# Patient Record
Sex: Female | Born: 1945 | Race: White | Hispanic: No | Marital: Married
Health system: Southern US, Community
[De-identification: ages and names within clinical notes are randomized; demographics above are authoritative.]

---

## 1982-12-22 HISTORY — PX: BREAST EXCISIONAL BIOPSY: SUR124

## 2000-02-26 ENCOUNTER — Encounter: Payer: Self-pay | Admitting: Obstetrics and Gynecology

## 2000-02-26 ENCOUNTER — Encounter: Admission: RE | Admit: 2000-02-26 | Discharge: 2000-02-26 | Payer: Self-pay | Admitting: Obstetrics and Gynecology

## 2001-03-01 ENCOUNTER — Encounter: Admission: RE | Admit: 2001-03-01 | Discharge: 2001-03-01 | Payer: Self-pay | Admitting: Obstetrics and Gynecology

## 2001-03-01 ENCOUNTER — Encounter: Payer: Self-pay | Admitting: Obstetrics and Gynecology

## 2002-03-03 ENCOUNTER — Encounter: Payer: Self-pay | Admitting: Obstetrics and Gynecology

## 2002-03-03 ENCOUNTER — Encounter: Admission: RE | Admit: 2002-03-03 | Discharge: 2002-03-03 | Payer: Self-pay | Admitting: Obstetrics and Gynecology

## 2002-07-03 ENCOUNTER — Encounter (INDEPENDENT_AMBULATORY_CARE_PROVIDER_SITE_OTHER): Payer: Self-pay | Admitting: *Deleted

## 2002-07-03 ENCOUNTER — Encounter: Payer: Self-pay | Admitting: Emergency Medicine

## 2002-07-03 ENCOUNTER — Observation Stay (HOSPITAL_COMMUNITY): Admission: EM | Admit: 2002-07-03 | Discharge: 2002-07-04 | Payer: Self-pay | Admitting: Emergency Medicine

## 2003-03-06 ENCOUNTER — Encounter: Admission: RE | Admit: 2003-03-06 | Discharge: 2003-03-06 | Payer: Self-pay | Admitting: Obstetrics and Gynecology

## 2003-03-06 ENCOUNTER — Encounter: Payer: Self-pay | Admitting: Obstetrics and Gynecology

## 2004-03-06 ENCOUNTER — Encounter: Admission: RE | Admit: 2004-03-06 | Discharge: 2004-03-06 | Payer: Self-pay | Admitting: Obstetrics and Gynecology

## 2005-03-10 ENCOUNTER — Encounter: Admission: RE | Admit: 2005-03-10 | Discharge: 2005-03-10 | Payer: Self-pay | Admitting: Obstetrics and Gynecology

## 2006-03-11 ENCOUNTER — Encounter: Admission: RE | Admit: 2006-03-11 | Discharge: 2006-03-11 | Payer: Self-pay | Admitting: Obstetrics and Gynecology

## 2006-08-25 ENCOUNTER — Emergency Department (HOSPITAL_COMMUNITY): Admission: EM | Admit: 2006-08-25 | Discharge: 2006-08-25 | Payer: Self-pay | Admitting: Family Medicine

## 2007-03-25 ENCOUNTER — Encounter: Admission: RE | Admit: 2007-03-25 | Discharge: 2007-03-25 | Payer: Self-pay | Admitting: Obstetrics and Gynecology

## 2008-03-28 ENCOUNTER — Encounter: Admission: RE | Admit: 2008-03-28 | Discharge: 2008-03-28 | Payer: Self-pay | Admitting: Family Medicine

## 2008-05-17 ENCOUNTER — Emergency Department (HOSPITAL_COMMUNITY): Admission: EM | Admit: 2008-05-17 | Discharge: 2008-05-17 | Payer: Self-pay | Admitting: Emergency Medicine

## 2009-03-29 ENCOUNTER — Encounter: Admission: RE | Admit: 2009-03-29 | Discharge: 2009-03-29 | Payer: Self-pay | Admitting: Obstetrics and Gynecology

## 2010-04-01 ENCOUNTER — Encounter: Admission: RE | Admit: 2010-04-01 | Discharge: 2010-04-01 | Payer: Self-pay | Admitting: Obstetrics and Gynecology

## 2010-12-17 ENCOUNTER — Encounter
Admission: RE | Admit: 2010-12-17 | Discharge: 2010-12-17 | Payer: Self-pay | Source: Home / Self Care | Attending: Obstetrics and Gynecology | Admitting: Obstetrics and Gynecology

## 2011-03-17 ENCOUNTER — Other Ambulatory Visit: Payer: Self-pay | Admitting: Obstetrics and Gynecology

## 2011-03-17 DIAGNOSIS — Z1231 Encounter for screening mammogram for malignant neoplasm of breast: Secondary | ICD-10-CM

## 2011-04-08 ENCOUNTER — Ambulatory Visit
Admission: RE | Admit: 2011-04-08 | Discharge: 2011-04-08 | Disposition: A | Discharge status: Home / Self Care | Payer: Commercial Indemnity | Source: Ambulatory Visit | Attending: Obstetrics and Gynecology | Admitting: Obstetrics and Gynecology

## 2011-04-08 DIAGNOSIS — Z1231 Encounter for screening mammogram for malignant neoplasm of breast: Secondary | ICD-10-CM

## 2011-05-09 NOTE — H&P (Signed)
[place]  Patient:    Sonya Torres, Sonya Torres Visit Number: [id number] MRN: [id number]          Service Type: SUR Location: 5700 [id number] Attending Physician:  Brandy Hale Dictated by:   Angelia Mould. Derrell Lolling, M.D. Admit Date:  07/03/2002 Discharge Date: 07/04/2002   CC:         Forrestine Him, MD   History and Physical  CHIEF COMPLAINT:  Abdominal pain and vomiting.  HISTORY OF PRESENT ILLNESS:  This is a 65 year old white female in good health.  Yesterday evening, she became nauseated and developed back pain.  She had three episodes of diarrhea but saw no blood in her stool.  Later that evening, she began vomiting and vomited three or four times and then had dry heaves after that.  At 2:30 a.m. this morning, she noted the onset of sharp right-lower-quadrant pain which has been progressive in intensity and getting worse.  She feels ill currently.  Denies prior similar episodes.  Denies voiding symptoms.  Has not had any gynecologic problems.  Denies history of any prior GI disease.  She was evaluated by Dr. Cathren Laine in the ER.  He obtained blood work which shows a white blood cell count of 19,000.  A CT scan was obtained, which shows "definite appendicitis."  The appendix is swollen with inflammatory stranding around it, and there is some fluid in the pelvis.  No other abnormalities noted.  She is admitted for appendectomy.  PAST MEDICAL HISTORY:  She had a right breast biopsy for a cyst in the past. She has had bunion surgery.  She has migraine headaches.  She has osteoporosis.  She denies any other medical problems.  CURRENT MEDICATIONS:  1. Corgard 40 mg q.d.  2. Anaprox p.r.n.  3. Cafergot p.r.n.  4. Multivitamins.  DRUG ALLERGIES:  CODEINE.  SOCIAL HISTORY:  The patient lives in [place].  She is married.  She has two grown daughters.  Denies the use of alcohol or tobacco.  She is a housewife.  FAMILY  HISTORY:  Mother deceased with colon cancer.  Father died of head and neck cancer.  REVIEW OF SYSTEMS:  All systems reviewed and are noncontributory except as described above.  PHYSICAL EXAMINATION:  GENERAL:  A healthy-appearing middle-aged woman in no distress.  VITAL SIGNS:  Temperature 97.5, pulse 74, respirations 16, blood pressure 126/54.  HEENT:  Sclerae clear, extraocular movements intact, oropharynx clear.  NECK:  Supple, nontender, no mass, no adenopathy, no bruit.  LUNGS:  Clear to auscultation.  CARDIAC:  Regular rate and rhythm, no murmur.  ABDOMEN:  Soft.  Active bowel sounds.  Not distended.  She has localized tenderness and involuntary guarding in the right lower quadrant which is reproducible.  There is no mass or hernia noted.  EXTREMITIES:  No edema, good pulses.  NEUROLOGIC:  Grossly within normal limits.  IMPRESSION:  Acute appendicitis.  PLAN:  The patient will be taken to the operating room for an appendectomy.  I have discussed the indicates and details of surgery with her and her husband. The risks and complications have been outlined, including but not limited to bleeding, infection, conversion to open laparotomy, injury to adjacent organs, wound problems such as infection or hernia, and other unforeseen problems. She seems to understand these issues well.  At this time, all of her questions are answered.  She is in agreement with this plan. Dictated by:   Angelia Mould. Derrell Lolling, M.D. Attending  Physician:  Brandy Hale DD:  07/03/02 TD:  07/05/02 Job: [id number]

## 2011-05-09 NOTE — Op Note (Signed)
[place]  Patient:    Sonya Torres, Sonya Torres Visit Number: [id number] MRN: [id number]          Service Type: SUR Location: 5700 [id number] [id number] Attending Physician:  Brandy Hale Dictated by:   Angelia Mould. Derrell Lolling, M.D. Proc. Date: 07/03/02 Admit Date:  07/03/2002 Discharge Date: 07/04/2002   CC:         Desma Maxim, M.D.  Genene Churn. Sherin Quarry, M.D.   Operative Report  PREOPERATIVE DIAGNOSIS:  Acute appendicitis.  POSTOPERATIVE DIAGNOSIS:  Acute appendicitis.  OPERATION PERFORMED:  Laparoscopic appendectomy.  SURGEON:  Claud Kelp, M.D.  OPERATIVE INDICATION:  This is a 65 year old white female who presents with a 24-hour history of nausea, vomiting, and right lower quadrant pain.  On exam, she was found to have localized tenderness and involuntary guarding of the right lower quadrant.  White blood cell count was 19,000.  CT scan showed findings consistent with appendicitis.  I was then asked to see her.  She was brought to the operating room urgently.  OPERATIVE FINDINGS:  The appendix was acutely inflamed but was not ruptured. There was some yellow, turbid, watery fluid in the pelvis which was irrigated out but there was no abscess.  The terminal ileum and right colon, uterus and ovaries looked normal.  The gallbladder and liver looked normal.  OPERATIVE TECHNIQUE:  Following the induction of general endotracheal anesthesia, the patients abdomen was prepped and draped in the sterile fashion.  Marcaine 0.5% with epinephrine was used as a local infiltration anesthetic.  A vertically-oriented incision was made at the superior rim of the umbilicus.  The fascia was incised at the midline and the abdominal cavity entered under direct vision.  A 10 mm trocar was inserted and secured with a purse-string suture of 0-Vicryl.  Pneumoperitoneum was created.  The video camera was inserted with the visualizations and findings as described  above. A 5 mm trocar was placed in the right upper quadrant and a 12 mm trocar placed in the left suprapubic area.  Omental adhesions were carefully teased away from the appendix and the entire appendix was mobilized.  We encircled the appendix with an endoloop tie.  The mesoappendix and appendiceal artery were divided using the Harmonic scalpel.  We skeletonized the mesoappendix until we could isolate the appendix all the way back to its insertion on the cecum.  An endo-GI stapling device was used to staple and transect the appendix at its insertion to the cecum.  The appendix was placed in a specimen bag and removed.  The operative field was irrigated and inspected carefully.  The staple line looked quite secure.  There was no bleeding.  Peristaltic gutter and pelvis were irrigated again.  We were satisfied that everything looked fine.  The trocars were removed under direct vision and there was no bleeding from the trocar sites.  Pneumoperitoneum was released.  The fascia at the umbilicus and the fascia in the suprapubic trocar sites were closed with 0-Vicryl sutures.  The skin incisions were irrigated with saline and closed with subcuticular sutures of 4-0 Vicryl and Steri-Strips.  Clean bandages were placed and the patient was taken to the recovery room in stable condition. Estimated blood loss was about 10 cc.  Complications were none.  Sponge and instrument counts were correct. Dictated by:   Angelia Mould. Derrell Lolling, M.D. Attending Physician:  Brandy Hale DD:  07/03/02 TD:  07/05/02 Job: [id number]

## 2012-03-16 ENCOUNTER — Other Ambulatory Visit: Payer: Self-pay | Admitting: Obstetrics and Gynecology

## 2012-03-16 DIAGNOSIS — Z1231 Encounter for screening mammogram for malignant neoplasm of breast: Secondary | ICD-10-CM

## 2012-04-09 ENCOUNTER — Ambulatory Visit
Admission: RE | Admit: 2012-04-09 | Discharge: 2012-04-09 | Disposition: A | Discharge status: Home / Self Care | Payer: Medicare Other | Source: Ambulatory Visit | Attending: Obstetrics and Gynecology | Admitting: Obstetrics and Gynecology

## 2012-04-09 DIAGNOSIS — Z1231 Encounter for screening mammogram for malignant neoplasm of breast: Secondary | ICD-10-CM

## 2012-11-09 ENCOUNTER — Telehealth (HOSPITAL_COMMUNITY): Payer: Self-pay | Admitting: Marriage and Family Therapist

## 2013-01-11 NOTE — Telephone Encounter (Signed)
No contact.

## 2013-03-10 ENCOUNTER — Other Ambulatory Visit: Payer: Self-pay

## 2013-04-12 ENCOUNTER — Ambulatory Visit: Payer: Medicare Other

## 2013-05-11 ENCOUNTER — Ambulatory Visit
Admission: RE | Admit: 2013-05-11 | Discharge: 2013-05-11 | Disposition: A | Discharge status: Home / Self Care | Payer: Medicare Other | Source: Ambulatory Visit

## 2013-05-11 DIAGNOSIS — Z1231 Encounter for screening mammogram for malignant neoplasm of breast: Secondary | ICD-10-CM

## 2014-04-12 ENCOUNTER — Other Ambulatory Visit: Payer: Self-pay

## 2014-04-12 DIAGNOSIS — Z1231 Encounter for screening mammogram for malignant neoplasm of breast: Secondary | ICD-10-CM

## 2014-05-12 ENCOUNTER — Ambulatory Visit: Payer: Medicare Other

## 2014-05-16 ENCOUNTER — Ambulatory Visit
Admission: RE | Admit: 2014-05-16 | Discharge: 2014-05-16 | Disposition: A | Discharge status: Home / Self Care | Payer: Self-pay | Source: Ambulatory Visit

## 2014-05-16 ENCOUNTER — Encounter (INDEPENDENT_AMBULATORY_CARE_PROVIDER_SITE_OTHER): Payer: Self-pay

## 2014-05-16 DIAGNOSIS — Z1231 Encounter for screening mammogram for malignant neoplasm of breast: Secondary | ICD-10-CM

## 2015-04-18 ENCOUNTER — Other Ambulatory Visit: Payer: Self-pay | Admitting: Obstetrics and Gynecology

## 2015-04-19 LAB — CYTOLOGY - PAP

## 2015-04-23 ENCOUNTER — Other Ambulatory Visit: Payer: Self-pay

## 2015-04-23 DIAGNOSIS — Z1231 Encounter for screening mammogram for malignant neoplasm of breast: Secondary | ICD-10-CM

## 2015-05-18 ENCOUNTER — Ambulatory Visit: Payer: Medicare Other

## 2015-06-14 ENCOUNTER — Ambulatory Visit
Admission: RE | Admit: 2015-06-14 | Discharge: 2015-06-14 | Disposition: A | Discharge status: Home / Self Care | Payer: Medicare Other | Source: Ambulatory Visit

## 2015-06-14 DIAGNOSIS — Z1231 Encounter for screening mammogram for malignant neoplasm of breast: Secondary | ICD-10-CM

## 2016-05-09 ENCOUNTER — Other Ambulatory Visit: Payer: Self-pay

## 2016-05-09 DIAGNOSIS — Z1231 Encounter for screening mammogram for malignant neoplasm of breast: Secondary | ICD-10-CM

## 2016-06-16 ENCOUNTER — Ambulatory Visit
Admission: RE | Admit: 2016-06-16 | Discharge: 2016-06-16 | Disposition: A | Discharge status: Home / Self Care | Payer: Medicare Other | Source: Ambulatory Visit

## 2016-06-16 DIAGNOSIS — Z1231 Encounter for screening mammogram for malignant neoplasm of breast: Secondary | ICD-10-CM

## 2016-12-11 ENCOUNTER — Emergency Department (HOSPITAL_COMMUNITY): Payer: Medicare Other

## 2016-12-11 ENCOUNTER — Emergency Department (HOSPITAL_COMMUNITY)
Admission: EM | Admit: 2016-12-11 | Discharge: 2016-12-11 | Disposition: A | Discharge status: Home / Self Care | Payer: Medicare Other | Attending: Emergency Medicine | Admitting: Emergency Medicine

## 2016-12-11 DIAGNOSIS — R791 Abnormal coagulation profile: Secondary | ICD-10-CM | POA: Diagnosis not present

## 2016-12-11 DIAGNOSIS — R202 Paresthesia of skin: Secondary | ICD-10-CM | POA: Insufficient documentation

## 2016-12-11 DIAGNOSIS — Z79899 Other long term (current) drug therapy: Secondary | ICD-10-CM | POA: Insufficient documentation

## 2016-12-11 DIAGNOSIS — R200 Anesthesia of skin: Secondary | ICD-10-CM | POA: Diagnosis present

## 2016-12-11 LAB — DIFFERENTIAL
Basophils Absolute: 0 10*3/uL (ref 0.0–0.1)
Basophils Relative: 1 %
EOS ABS: 0.3 10*3/uL (ref 0.0–0.7)
EOS PCT: 6 %
LYMPHS ABS: 1.6 10*3/uL (ref 0.7–4.0)
LYMPHS PCT: 31 %
MONOS PCT: 8 %
Monocytes Absolute: 0.4 10*3/uL (ref 0.1–1.0)
Neutro Abs: 2.8 10*3/uL (ref 1.7–7.7)
Neutrophils Relative %: 54 %

## 2016-12-11 LAB — COMPREHENSIVE METABOLIC PANEL
ALK PHOS: 40 U/L (ref 38–126)
ALT: 22 U/L (ref 14–54)
ANION GAP: 12 (ref 5–15)
AST: 27 U/L (ref 15–41)
Albumin: 4.3 g/dL (ref 3.5–5.0)
BILIRUBIN TOTAL: 0.8 mg/dL (ref 0.3–1.2)
BUN: 11 mg/dL (ref 6–20)
CALCIUM: 9.5 mg/dL (ref 8.9–10.3)
CO2: 24 mmol/L (ref 22–32)
Chloride: 100 mmol/L — ABNORMAL LOW (ref 101–111)
Creatinine, Ser: 0.86 mg/dL (ref 0.44–1.00)
GFR calc non Af Amer: >60 mL/min (ref >60)
GLUCOSE: 105 mg/dL — AB (ref 65–99)
Potassium: 3.6 mmol/L (ref 3.5–5.1)
Sodium: 136 mmol/L (ref 135–145)
TOTAL PROTEIN: 7.7 g/dL (ref 6.5–8.1)

## 2016-12-11 LAB — CBC
HCT: 46 % (ref 36.0–46.0)
HEMOGLOBIN: 15.7 g/dL — AB (ref 12.0–15.0)
MCH: 31.7 pg (ref 26.0–34.0)
MCHC: 34.1 g/dL (ref 30.0–36.0)
MCV: 92.7 fL (ref 78.0–100.0)
Platelets: 177 10*3/uL (ref 150–400)
RBC: 4.96 MIL/uL (ref 3.87–5.11)
RDW: 12.9 % (ref 11.5–15.5)
WBC: 5.1 10*3/uL (ref 4.0–10.5)

## 2016-12-11 LAB — PROTIME-INR
INR: 0.98
PROTHROMBIN TIME: 13 s (ref 11.4–15.2)

## 2016-12-11 LAB — I-STAT CHEM 8, ED
BUN: 14 mg/dL (ref 6–20)
Calcium, Ion: 1.1 mmol/L — ABNORMAL LOW (ref 1.15–1.40)
Chloride: 98 mmol/L — ABNORMAL LOW (ref 101–111)
Creatinine, Ser: 0.8 mg/dL (ref 0.44–1.00)
Glucose, Bld: 107 mg/dL — ABNORMAL HIGH (ref 65–99)
HCT: 47 % — ABNORMAL HIGH (ref 36.0–46.0)
HEMOGLOBIN: 16 g/dL — AB (ref 12.0–15.0)
Potassium: 3.5 mmol/L (ref 3.5–5.1)
SODIUM: 136 mmol/L (ref 135–145)
TCO2: 24 mmol/L (ref 0–100)

## 2016-12-11 LAB — I-STAT TROPONIN, ED: TROPONIN I, POC: 0.01 ng/mL (ref 0.00–0.08)

## 2016-12-11 LAB — APTT: aPTT: 27 seconds (ref 24–36)

## 2016-12-11 MED ORDER — LORAZEPAM 2 MG/ML IJ SOLN
1.0000 mg | Freq: Once | INTRAMUSCULAR | Status: AC | PRN
  Administered 2016-12-11: 1 mg via INTRAVENOUS
  Filled 2016-12-11: qty 1

## 2016-12-11 NOTE — ED Notes (Signed)
Per patients daughter over the last few months she has had a lot of memory impairment that has been worse over the last two days, Saturday they were all together and her mother had forgotten by Sunday that they were all together.

## 2016-12-11 NOTE — ED Notes (Signed)
Pt returned from mri

## 2016-12-11 NOTE — ED Triage Notes (Addendum)
Pt presents with c/o numbness. The numbness began 2 days ago radiating across her entire chest into both arms. She felt some tightness in her chest at that time that resolved. Yesterday she began to feet a tight numbness in her throat. Today the numbness has been intermittent to the left side of her face and she feels that her eyes are swollen. No code stroke called at this time, pt sent directly to a room and Dr. Oleta Mouse notified about pt complaint

## 2016-12-11 NOTE — Discharge Instructions (Signed)
Your MRI of the neck and head overall is reassuring. There are no signs of stroke. 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  The remainder of your blood work is reassuring.  Please follow-up with your primary care provider.  Please return for worsening symptoms, including confusion, new weakness of arm of leg, numbness of arms/legs, new vision or speech changes or any other symptoms concerning to you.

## 2016-12-11 NOTE — ED Notes (Addendum)
Per patient, the numbness started 2 hours ago in her face, she did have some "tingling" in her arms x 2 days ago, edp at [facility]

## 2016-12-11 NOTE — ED Provider Notes (Signed)
[facility] DEPT Provider Note   CSN: [id number] Arrival date & time: 12/11/16  1112     History   Chief Complaint Chief Complaint  Patient presents with  . Numbness    HPI Sonya Torres is a 70 y.o. female.  HPI 70 year old female who presents with left cheek numbness. She is otherwise healthy with no significant past medical history. States that over the past 2 weeks she has had some shooting intermittent numbness or tingling  in bilateral arms across the anterior chest. Her last occurrence of this was 2 days ago. Yesterday she says that she felt some tightness and fullness in her throat and anterior neck, that resolved on its own. This morning at around 9 to 9:30 AM while at rest noted some tingling over the left side of her face. Her left side of the face felt heavy , but she did not have facial droop. States that on arrival she only has numbness or tingling that is mild in nature involving the left cheek. No vision or speech changes, confusion, focal numbness or weakness of the arms or legs, chest pain or difficulty breathing. She reports that she recently has been worked up for potential conduction problem, and her doctor says that she did not have arrhythmia such as atrial fibrillation and she has negative carotid ultrasounds recently that showed normal carotid arteries. During ED course is that the tingling of her left cheek resolved, and now just feels tight in her left cheek.   No past medical history on file.  There are no active problems to display for this patient.   No past surgical history on file.  OB History    No data available       Home Medications    Prior to Admission medications   Medication Sig Start Date End Date Taking? Authorizing Provider  ALPRAZolam Duanne Moron) 0.25 MG tablet Take 0.25 mg by mouth at bedtime as needed for anxiety.   Yes Historical Provider, MD  calcium-vitamin D (OSCAL WITH D) 250-125 MG-UNIT tablet Take 1 tablet by mouth daily.    Yes Historical Provider, MD  gabapentin (NEURONTIN) 100 MG capsule Take 100 mg by mouth at bedtime. 11/26/16  Yes Historical Provider, MD  hydrochlorothiazide (HYDRODIURIL) 25 MG tablet Take 25 mg by mouth daily. 11/26/16  Yes Historical Provider, MD  Multiple Vitamin (MULTIVITAMIN WITH MINERALS) TABS tablet Take 1 tablet by mouth daily.   Yes Historical Provider, MD    Family History No family history on file.  Social History Social History  Substance Use Topics  . Smoking status: Not on file  . Smokeless tobacco: Not on file  . Alcohol use Not on file     Allergies   Codeine   Review of Systems Review of Systems 10/14 systems reviewed and are negative other than those stated in the HPI   Physical Exam Updated Vital Signs BP 133/73   Pulse 77   Temp 98 F (36.7 C) (Oral)   Resp 19   Ht 5\' 5"  (1.651 m)   Wt 110 lb (49.9 kg)   SpO2 98%   BMI 18.30 kg/m   Physical Exam Physical Exam  Nursing note and vitals reviewed. Constitutional: Well developed, well nourished, non-toxic, and in no acute distress Head: Normocephalic and atraumatic.  Mouth/Throat: Oropharynx is clear and moist.  Neck: Normal range of motion. Neck supple.  Cardiovascular: Normal rate and regular rhythm.   Pulmonary/Chest: Effort normal and breath sounds normal.  Abdominal: Soft. There is no  tenderness. There is no rebound and no guarding.  Musculoskeletal: Normal range of motion.  Skin: Skin is warm and dry.  Psychiatric: Cooperative Neurological:  Alert, oriented to person, place, time, and situation. Memory grossly in tact. Fluent speech. No dysarthria or aphasia.  Cranial nerves: VF are full.  Pupils are symmetric, and reactive to light. EOMI without nystagmus. No gaze deviation. Facial muscles symmetric with activation. Sensation to light touch over face in tact throughout, but reportedly diminished over left cheek. Hearing grossly in tact. Palate elevates symmetrically. Head turn and shoulder  shrug are intact. Tongue midline.  Reflexes defered.  Muscle bulk and tone normal. No pronator drift. Moves all extremities symmetrically. Sensation to light touch is in tact throughout in bilateral upper and lower extremities. Coordination reveals no dysmetria with finger to nose. Gait is narrow-based and steady. Non-ataxic.    ED Treatments / Results  Labs (all labs ordered are listed, but only abnormal results are displayed) Labs Reviewed  CBC - Abnormal; Notable for the following:       Result Value   Hemoglobin 15.7 (*)    All other components within normal limits  COMPREHENSIVE METABOLIC PANEL - Abnormal; Notable for the following:    Chloride 100 (*)    Glucose, Bld 105 (*)    All other components within normal limits  I-STAT CHEM 8, ED - Abnormal; Notable for the following:    Chloride 98 (*)    Glucose, Bld 107 (*)    Calcium, Ion 1.10 (*)    Hemoglobin 16.0 (*)    HCT 47.0 (*)    All other components within normal limits  PROTIME-INR  APTT  DIFFERENTIAL  I-STAT TROPOININ, ED  CBG MONITORING, ED    EKG  EKG Interpretation  Date/Time:  Thursday December 11 2016 11:20:05 EST Ventricular Rate:  74 PR Interval:  144 QRS Duration: 134 QT Interval:  390 QTC Calculation: 432 R Axis:   71 Text Interpretation:  Normal sinus rhythm with sinus arrhythmia Left bundle branch block No significant change since last tracing Confirmed by Maryan Rued  MD, Loree Fee ([id number]) on 12/11/2016 11:23:46 AM       Radiology Ct Head Wo Contrast  Result Date: 12/11/2016 CLINICAL DATA:  Left facial tingling EXAM: CT HEAD WITHOUT CONTRAST TECHNIQUE: Contiguous axial images were obtained from the base of the skull through the vertex without intravenous contrast. COMPARISON:  None. FINDINGS: Brain: No evidence of acute infarction, hemorrhage, hydrocephalus, extra-axial collection or mass lesion/mass effect. Vascular: No hyperdense vessel or unexpected calcification. Skull: Normal. Negative  for fracture or focal lesion. Sinuses/Orbits: No acute finding. Other: None. IMPRESSION: No acute intracranial abnormality noted. Electronically Signed   By: Inez Catalina M.D.   On: 12/11/2016 12:25   Mr Brain Wo Contrast  Result Date: 12/11/2016 CLINICAL DATA:  Numbness began 2 days ago radiating to both arms. Tightness in the chest. LEFT-sided face numbness. EXAM: MRI HEAD WITHOUT CONTRAST TECHNIQUE: Multiplanar, multiecho pulse sequences of the brain and surrounding structures were obtained without intravenous contrast. COMPARISON:  MRI brain earlier today. MRI cervical spine reported separately. FINDINGS: Brain: No evidence for acute infarction, hemorrhage, mass lesion, hydrocephalus, or extra-axial fluid. Mild atrophy, not unexpected for age. Mild subcortical and periventricular T2 and FLAIR hyperintensities, likely chronic microvascular ischemic change. Vascular: Normal flow voids. Skull and upper cervical spine: Normal marrow signal. Spondylosis reported separately. Sinuses/Orbits: Negative. Other: None. Compared with earlier CT, good general agreement. IMPRESSION: Mild atrophy and small vessel disease. No acute intracranial findings. Electronically  Signed   By: Staci Righter M.D.   On: 12/11/2016 14:28   Mr Cervical Spine Wo Contrast  Result Date: 12/11/2016 CLINICAL DATA:  Weakness. Numbness. Intermittently affecting the LEFT chest, arms and face. EXAM: MRI CERVICAL SPINE WITHOUT CONTRAST TECHNIQUE: Multiplanar, multisequence MR imaging of the cervical spine was performed. No intravenous contrast was administered. COMPARISON:  MRI brain reported separately. FINDINGS: Alignment: Anatomic except for trace mediated slip  C7-T1. Vertebrae: No fracture, evidence of discitis, or bone lesion. Cord: Normal signal and morphology. Posterior Fossa, vertebral arteries, paraspinal tissues: Negative. Disc levels: C2-3:  Unremarkable. C3-4: Disc space narrowing. Central protrusion. Mild facet arthropathy. No  definite foraminal narrowing. C4-5: Disc space narrowing. Disc osteophyte complex. Facet arthropathy and ligamentum flavum hypertrophy. Mild BILATERAL C5 foraminal narrowing. C5-6: Disc space narrowing. Disc osteophyte complex. LEFT greater than RIGHT uncinate spurring contributes to mild BILATERAL C6 foraminal narrowing. C6-7: Disc space narrowing. Disc osteophyte complex. LEFT greater than RIGHT uncinate spurring potentially contributes to BILATERAL C7 foraminal narrowing. C7-T1: Trace anterolisthesis. Mild facet arthropathy. No impingement. IMPRESSION: Multilevel spondylosis C3-C7. Mild stenosis without cord compression. Widespread foraminal narrowing due to disc osteophyte complex and uncinate spurring without dominant central disc extrusion. Electronically Signed   By: Staci Righter M.D.   On: 12/11/2016 14:42    Procedures Procedures (including critical care time)  Medications Ordered in ED Medications  LORazepam (ATIVAN) injection 1 mg (1 mg Intravenous Given 12/11/16 1323)     Initial Impression / Assessment and Plan / ED Course  I have reviewed the triage vital signs and the nursing notes.  Pertinent labs & imaging results that were available during my care of the patient were reviewed by me and considered in my medical decision making (see chart for details).  Clinical Course     Spoke with Dr. Leonel Ramsay as symptoms does not seem consistent with stroke. She has subtle tingling just over left cheek and normal remainder of neurological exam. ? Cervical spine process given intermittent tingling in bilateral arms. Dr. Leonel Ramsay recommending MRI head/c-spine. This is obtained, visualized and shows mild cervical spine arthritis but no CVA or acute spinal cord process or any thing acutely causing symptoms. Felt to be stable for discharge home with continued PCP follow-up. Strict return and follow-up instructions reviewed. She expressed understanding of all discharge instructions and felt  comfortable with the plan of care.   Final Clinical Impressions(s) / ED Diagnoses   Final diagnoses:  Paresthesia    New Prescriptions New Prescriptions   No medications on file     Forde Dandy, MD 12/11/16 1624

## 2016-12-11 NOTE — ED Notes (Signed)
Pt ambulated to the bathroom.  

## 2016-12-11 NOTE — ED Notes (Signed)
Patient transported to MRI 

## 2016-12-24 DIAGNOSIS — F411 Generalized anxiety disorder: Secondary | ICD-10-CM | POA: Diagnosis not present

## 2016-12-24 DIAGNOSIS — R0789 Other chest pain: Secondary | ICD-10-CM | POA: Diagnosis not present

## 2016-12-24 DIAGNOSIS — M4692 Unspecified inflammatory spondylopathy, cervical region: Secondary | ICD-10-CM | POA: Diagnosis not present

## 2017-01-21 DIAGNOSIS — R0789 Other chest pain: Secondary | ICD-10-CM | POA: Diagnosis not present

## 2017-01-21 DIAGNOSIS — F411 Generalized anxiety disorder: Secondary | ICD-10-CM | POA: Diagnosis not present

## 2017-01-21 DIAGNOSIS — R41840 Attention and concentration deficit: Secondary | ICD-10-CM | POA: Diagnosis not present

## 2017-05-05 DIAGNOSIS — H5213 Myopia, bilateral: Secondary | ICD-10-CM | POA: Diagnosis not present

## 2017-05-05 DIAGNOSIS — H25813 Combined forms of age-related cataract, bilateral: Secondary | ICD-10-CM | POA: Diagnosis not present

## 2017-05-05 DIAGNOSIS — H52223 Regular astigmatism, bilateral: Secondary | ICD-10-CM | POA: Diagnosis not present

## 2017-05-06 DIAGNOSIS — Z124 Encounter for screening for malignant neoplasm of cervix: Secondary | ICD-10-CM | POA: Diagnosis not present

## 2017-05-06 DIAGNOSIS — Z681 Body mass index (BMI) 19 or less, adult: Secondary | ICD-10-CM | POA: Diagnosis not present

## 2017-05-27 ENCOUNTER — Other Ambulatory Visit: Payer: Self-pay | Admitting: Family Medicine

## 2017-05-27 DIAGNOSIS — Z1231 Encounter for screening mammogram for malignant neoplasm of breast: Secondary | ICD-10-CM

## 2017-06-04 DIAGNOSIS — F411 Generalized anxiety disorder: Secondary | ICD-10-CM | POA: Diagnosis not present

## 2017-06-04 DIAGNOSIS — G4700 Insomnia, unspecified: Secondary | ICD-10-CM | POA: Diagnosis not present

## 2017-06-04 DIAGNOSIS — R636 Underweight: Secondary | ICD-10-CM | POA: Diagnosis not present

## 2017-06-04 DIAGNOSIS — M8580 Other specified disorders of bone density and structure, unspecified site: Secondary | ICD-10-CM | POA: Diagnosis not present

## 2017-06-04 DIAGNOSIS — Z1159 Encounter for screening for other viral diseases: Secondary | ICD-10-CM | POA: Diagnosis not present

## 2017-06-04 DIAGNOSIS — G43909 Migraine, unspecified, not intractable, without status migrainosus: Secondary | ICD-10-CM | POA: Diagnosis not present

## 2017-06-04 DIAGNOSIS — Z0000 Encounter for general adult medical examination without abnormal findings: Secondary | ICD-10-CM | POA: Diagnosis not present

## 2017-06-04 DIAGNOSIS — I10 Essential (primary) hypertension: Secondary | ICD-10-CM | POA: Diagnosis not present

## 2017-06-17 ENCOUNTER — Ambulatory Visit
Admission: RE | Admit: 2017-06-17 | Discharge: 2017-06-17 | Disposition: A | Discharge status: Home / Self Care | Payer: Medicare Other | Source: Ambulatory Visit | Attending: Family Medicine | Admitting: Family Medicine

## 2017-06-17 DIAGNOSIS — Z1231 Encounter for screening mammogram for malignant neoplasm of breast: Secondary | ICD-10-CM

## 2017-07-29 DIAGNOSIS — M8588 Other specified disorders of bone density and structure, other site: Secondary | ICD-10-CM | POA: Diagnosis not present

## 2017-08-10 IMAGING — MG 2D DIGITAL SCREENING BILATERAL MAMMOGRAM WITH CAD AND ADJUNCT TO
9 of 12 series · 9 of 28 positions shown · non-contrast
Comparison: Previous exam(s).

CLINICAL DATA: Screening.

EXAM:
2D DIGITAL SCREENING BILATERAL MAMMOGRAM WITH CAD AND ADJUNCT TOMO

[L MLO]
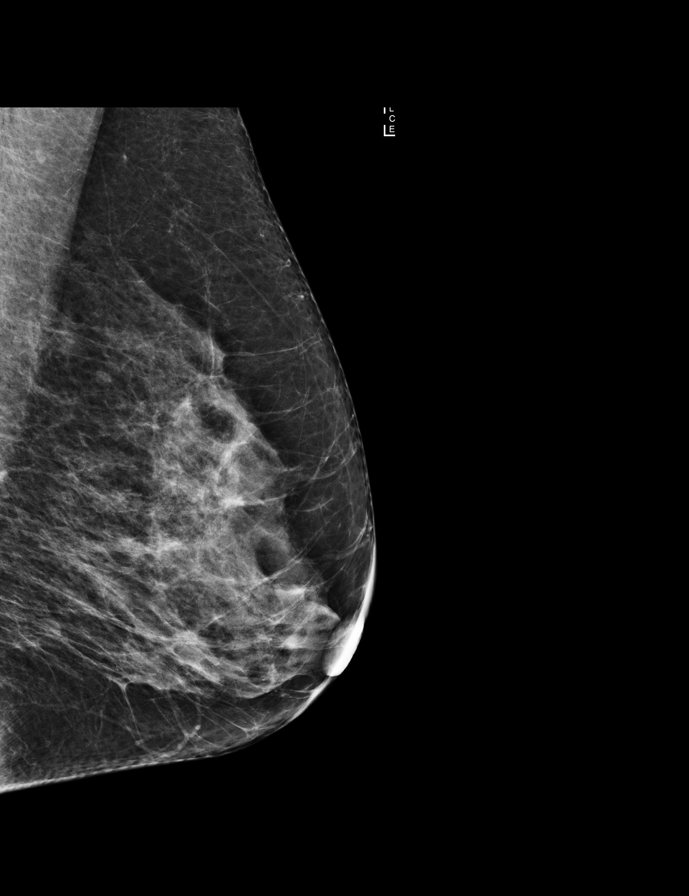

[L CC]
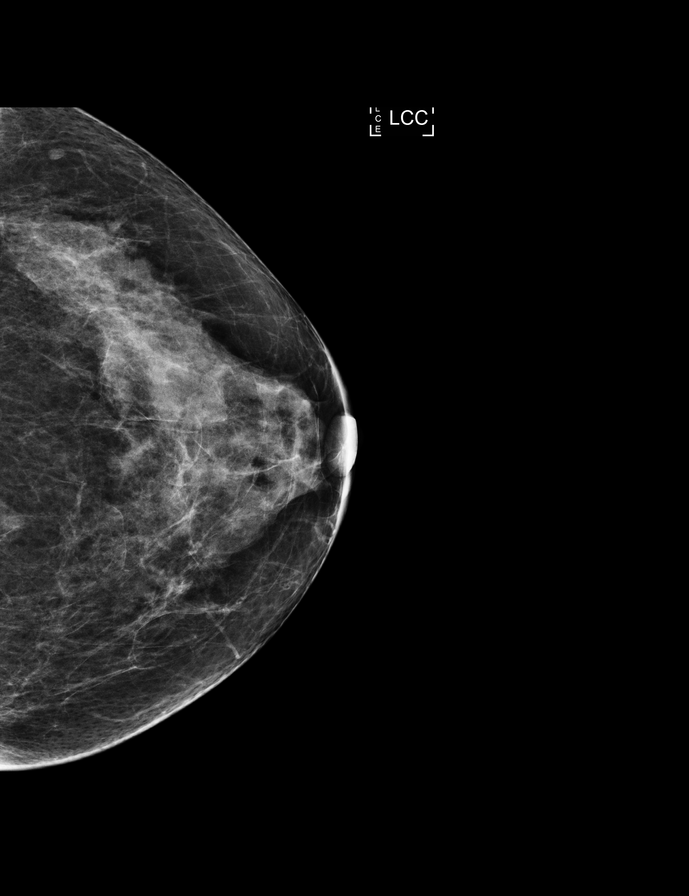

[R CC synth-2D]
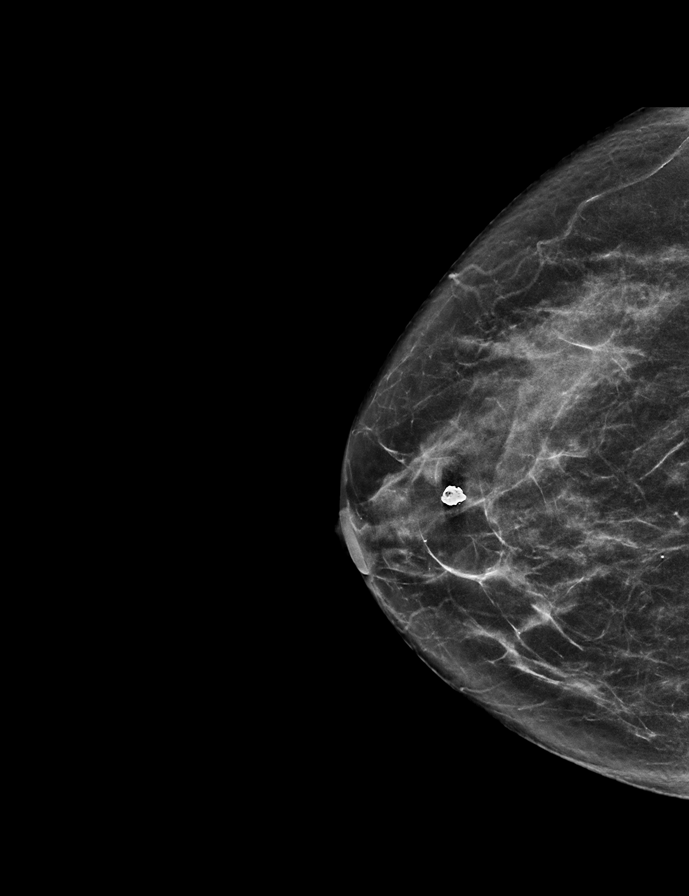

[R MLO]
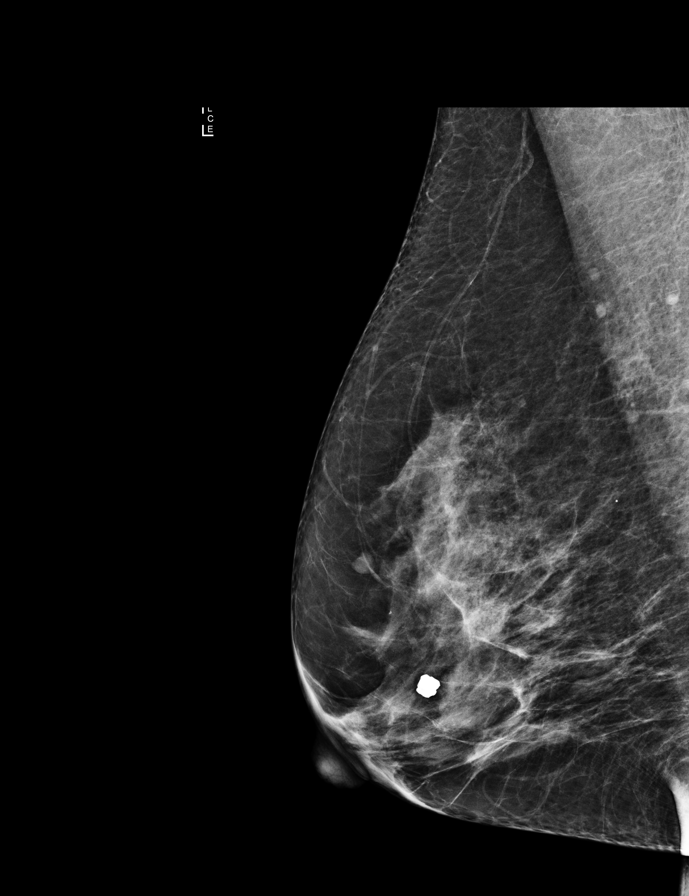

[R MLO synth-2D]
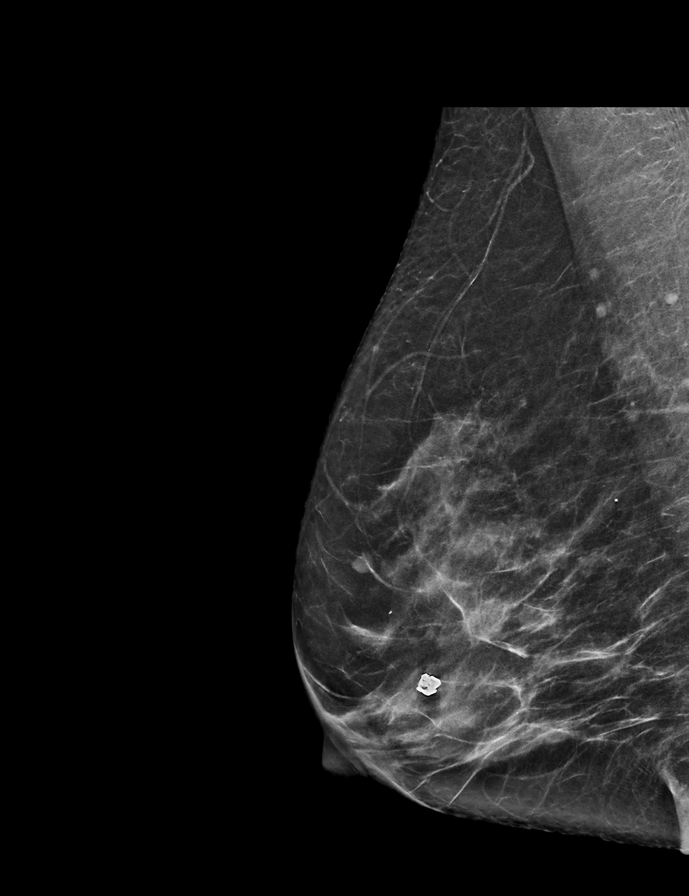

[L MLO synth-2D]
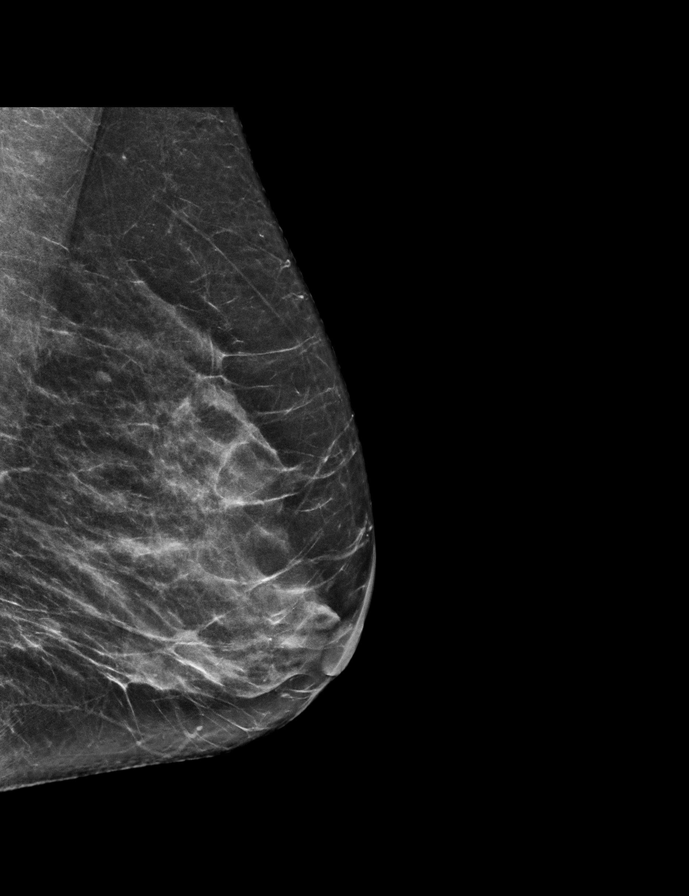

[R CC]
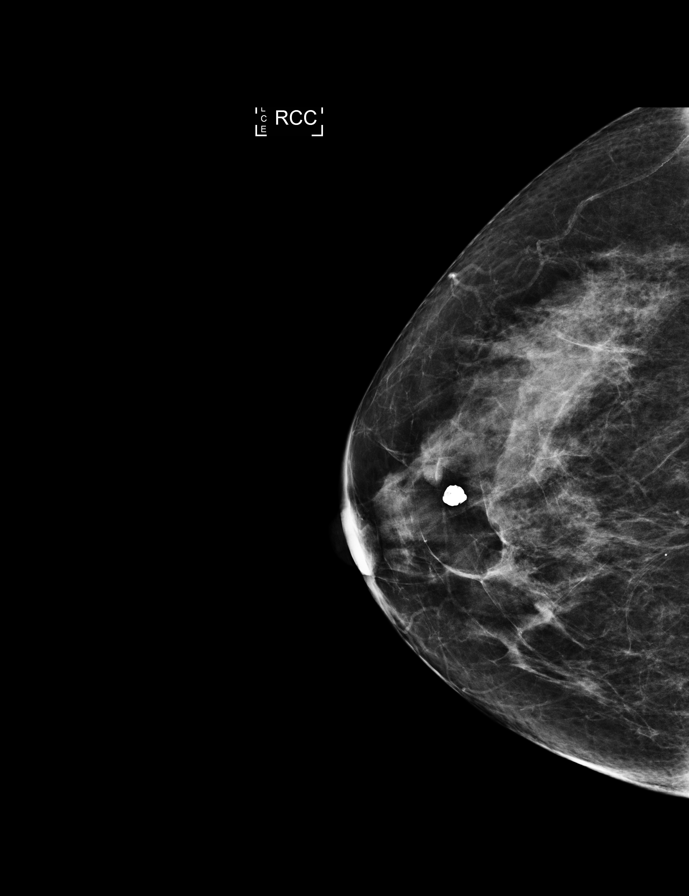

[L CC synth-2D]
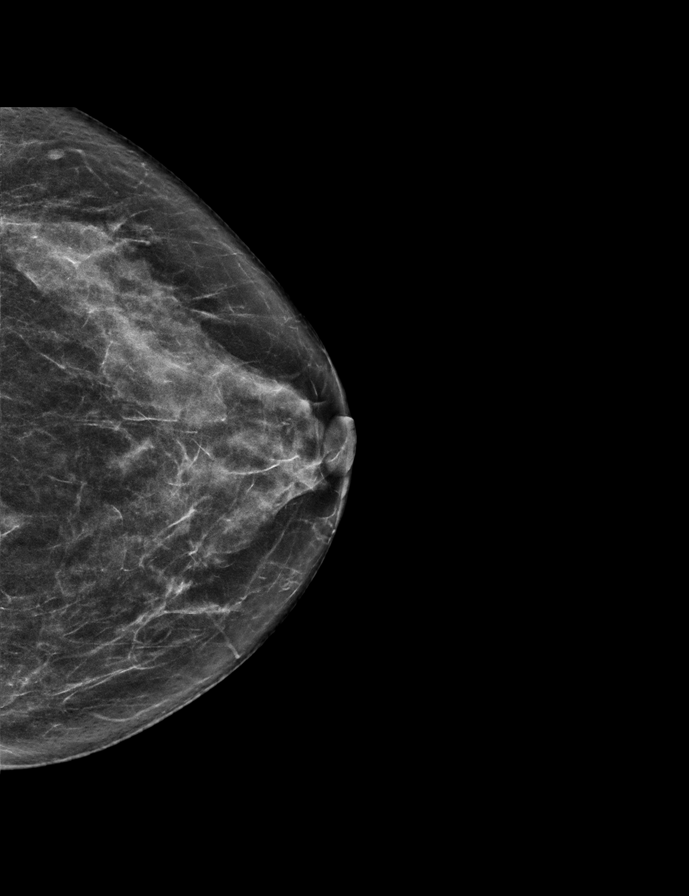

[L MLO tomo · tomo slice 29/57.0]
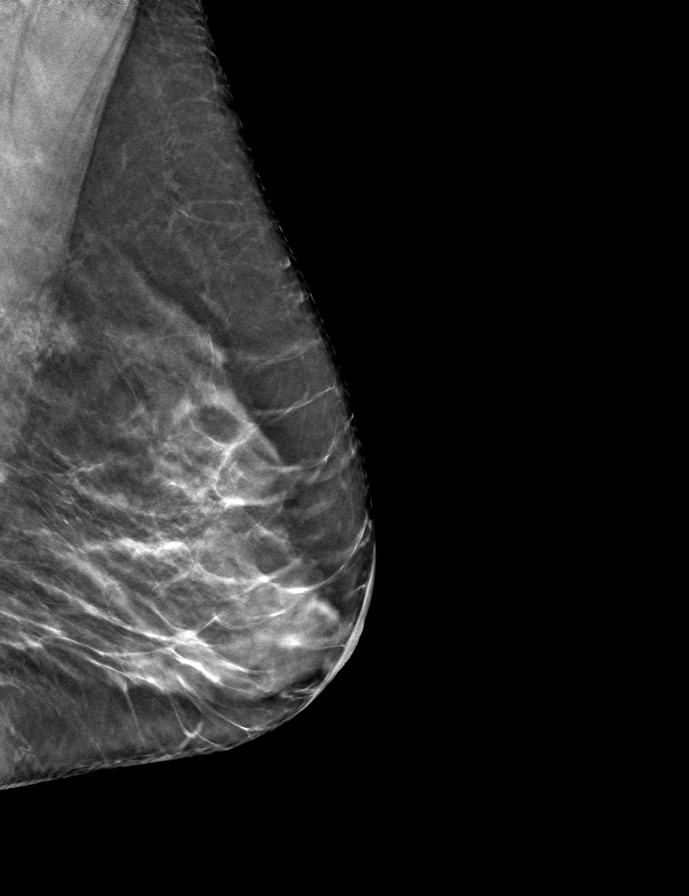

[9 of 28 positions shown; findings below may reference images not displayed]

ACR Breast Density Category c: The breast tissue is heterogeneously
dense, which may obscure small masses.
FINDINGS: There are no findings suspicious for malignancy. Images were
processed with CAD.
IMPRESSION: No mammographic evidence of malignancy. A result letter of this
screening mammogram will be mailed directly to the patient.

RECOMMENDATION:
Screening mammogram in one year. (Code:[id number]-[id number])

BI-RADS CATEGORY  1: Negative.

## 2017-09-29 DIAGNOSIS — Z23 Encounter for immunization: Secondary | ICD-10-CM | POA: Diagnosis not present

## 2017-11-03 DIAGNOSIS — H2513 Age-related nuclear cataract, bilateral: Secondary | ICD-10-CM | POA: Diagnosis not present

## 2017-11-03 DIAGNOSIS — H52223 Regular astigmatism, bilateral: Secondary | ICD-10-CM | POA: Diagnosis not present

## 2017-11-03 DIAGNOSIS — H5213 Myopia, bilateral: Secondary | ICD-10-CM | POA: Diagnosis not present

## 2018-01-18 DIAGNOSIS — D3131 Benign neoplasm of right choroid: Secondary | ICD-10-CM | POA: Diagnosis not present

## 2018-01-18 DIAGNOSIS — H2513 Age-related nuclear cataract, bilateral: Secondary | ICD-10-CM | POA: Diagnosis not present

## 2018-02-22 DIAGNOSIS — H33301 Unspecified retinal break, right eye: Secondary | ICD-10-CM | POA: Diagnosis not present

## 2018-02-22 DIAGNOSIS — H35372 Puckering of macula, left eye: Secondary | ICD-10-CM | POA: Diagnosis not present

## 2018-02-22 DIAGNOSIS — H2513 Age-related nuclear cataract, bilateral: Secondary | ICD-10-CM | POA: Diagnosis not present

## 2018-02-22 DIAGNOSIS — H35033 Hypertensive retinopathy, bilateral: Secondary | ICD-10-CM | POA: Diagnosis not present

## 2018-02-22 DIAGNOSIS — Z01818 Encounter for other preprocedural examination: Secondary | ICD-10-CM | POA: Diagnosis not present

## 2018-02-22 DIAGNOSIS — Z885 Allergy status to narcotic agent status: Secondary | ICD-10-CM | POA: Diagnosis not present

## 2018-02-22 DIAGNOSIS — D3131 Benign neoplasm of right choroid: Secondary | ICD-10-CM | POA: Diagnosis not present

## 2018-02-22 DIAGNOSIS — H43813 Vitreous degeneration, bilateral: Secondary | ICD-10-CM | POA: Diagnosis not present

## 2018-02-25 DIAGNOSIS — H25811 Combined forms of age-related cataract, right eye: Secondary | ICD-10-CM | POA: Diagnosis not present

## 2018-03-22 DIAGNOSIS — H25812 Combined forms of age-related cataract, left eye: Secondary | ICD-10-CM | POA: Diagnosis not present

## 2018-03-23 DIAGNOSIS — Z9842 Cataract extraction status, left eye: Secondary | ICD-10-CM | POA: Diagnosis not present

## 2018-03-23 DIAGNOSIS — Z48810 Encounter for surgical aftercare following surgery on the sense organs: Secondary | ICD-10-CM | POA: Diagnosis not present

## 2018-03-23 DIAGNOSIS — D3131 Benign neoplasm of right choroid: Secondary | ICD-10-CM | POA: Diagnosis not present

## 2018-03-23 DIAGNOSIS — Z961 Presence of intraocular lens: Secondary | ICD-10-CM | POA: Diagnosis not present

## 2018-03-23 DIAGNOSIS — Z9841 Cataract extraction status, right eye: Secondary | ICD-10-CM | POA: Diagnosis not present

## 2018-05-10 DIAGNOSIS — Z01419 Encounter for gynecological examination (general) (routine) without abnormal findings: Secondary | ICD-10-CM | POA: Diagnosis not present

## 2018-05-10 DIAGNOSIS — Z681 Body mass index (BMI) 19 or less, adult: Secondary | ICD-10-CM | POA: Diagnosis not present

## 2018-05-13 DIAGNOSIS — Z1211 Encounter for screening for malignant neoplasm of colon: Secondary | ICD-10-CM | POA: Diagnosis not present

## 2018-05-13 DIAGNOSIS — Z800 Family history of malignant neoplasm of digestive organs: Secondary | ICD-10-CM | POA: Diagnosis not present

## 2018-05-13 DIAGNOSIS — K640 First degree hemorrhoids: Secondary | ICD-10-CM | POA: Diagnosis not present

## 2018-05-24 ENCOUNTER — Other Ambulatory Visit: Payer: Self-pay | Admitting: Family Medicine

## 2018-05-24 DIAGNOSIS — Z1231 Encounter for screening mammogram for malignant neoplasm of breast: Secondary | ICD-10-CM

## 2018-06-23 DIAGNOSIS — I10 Essential (primary) hypertension: Secondary | ICD-10-CM | POA: Diagnosis not present

## 2018-06-23 DIAGNOSIS — G43909 Migraine, unspecified, not intractable, without status migrainosus: Secondary | ICD-10-CM | POA: Diagnosis not present

## 2018-06-23 DIAGNOSIS — F411 Generalized anxiety disorder: Secondary | ICD-10-CM | POA: Diagnosis not present

## 2018-06-23 DIAGNOSIS — M8580 Other specified disorders of bone density and structure, unspecified site: Secondary | ICD-10-CM | POA: Diagnosis not present

## 2018-06-23 DIAGNOSIS — G4700 Insomnia, unspecified: Secondary | ICD-10-CM | POA: Diagnosis not present

## 2018-06-23 DIAGNOSIS — Z0000 Encounter for general adult medical examination without abnormal findings: Secondary | ICD-10-CM | POA: Diagnosis not present

## 2018-06-23 DIAGNOSIS — E46 Unspecified protein-calorie malnutrition: Secondary | ICD-10-CM | POA: Diagnosis not present

## 2018-06-30 ENCOUNTER — Ambulatory Visit
Admission: RE | Admit: 2018-06-30 | Discharge: 2018-06-30 | Disposition: A | Discharge status: Home / Self Care | Payer: Medicare Other | Source: Ambulatory Visit | Attending: Family Medicine | Admitting: Family Medicine

## 2018-06-30 DIAGNOSIS — Z1231 Encounter for screening mammogram for malignant neoplasm of breast: Secondary | ICD-10-CM

## 2018-09-24 DIAGNOSIS — Z23 Encounter for immunization: Secondary | ICD-10-CM | POA: Diagnosis not present

## 2018-09-30 DIAGNOSIS — H35033 Hypertensive retinopathy, bilateral: Secondary | ICD-10-CM | POA: Diagnosis not present

## 2018-09-30 DIAGNOSIS — H33301 Unspecified retinal break, right eye: Secondary | ICD-10-CM | POA: Diagnosis not present

## 2018-09-30 DIAGNOSIS — H35372 Puckering of macula, left eye: Secondary | ICD-10-CM | POA: Diagnosis not present

## 2018-09-30 DIAGNOSIS — Z961 Presence of intraocular lens: Secondary | ICD-10-CM | POA: Diagnosis not present

## 2019-01-03 DIAGNOSIS — Z0120 Encounter for dental examination and cleaning without abnormal findings: Secondary | ICD-10-CM | POA: Diagnosis not present

## 2019-03-02 DIAGNOSIS — I10 Essential (primary) hypertension: Secondary | ICD-10-CM | POA: Diagnosis not present

## 2019-03-02 DIAGNOSIS — H04123 Dry eye syndrome of bilateral lacrimal glands: Secondary | ICD-10-CM | POA: Diagnosis not present

## 2019-03-02 DIAGNOSIS — H35033 Hypertensive retinopathy, bilateral: Secondary | ICD-10-CM | POA: Diagnosis not present

## 2019-03-02 DIAGNOSIS — Z961 Presence of intraocular lens: Secondary | ICD-10-CM | POA: Diagnosis not present

## 2019-05-18 DIAGNOSIS — Z681 Body mass index (BMI) 19 or less, adult: Secondary | ICD-10-CM | POA: Diagnosis not present

## 2019-05-18 DIAGNOSIS — Z01419 Encounter for gynecological examination (general) (routine) without abnormal findings: Secondary | ICD-10-CM | POA: Diagnosis not present

## 2019-06-07 ENCOUNTER — Other Ambulatory Visit: Payer: Self-pay | Admitting: Family Medicine

## 2019-06-07 DIAGNOSIS — Z1231 Encounter for screening mammogram for malignant neoplasm of breast: Secondary | ICD-10-CM

## 2019-07-07 DIAGNOSIS — Z0120 Encounter for dental examination and cleaning without abnormal findings: Secondary | ICD-10-CM | POA: Diagnosis not present

## 2019-07-11 DIAGNOSIS — I10 Essential (primary) hypertension: Secondary | ICD-10-CM | POA: Diagnosis not present

## 2019-07-11 DIAGNOSIS — E46 Unspecified protein-calorie malnutrition: Secondary | ICD-10-CM | POA: Diagnosis not present

## 2019-07-13 DIAGNOSIS — I10 Essential (primary) hypertension: Secondary | ICD-10-CM | POA: Diagnosis not present

## 2019-07-13 DIAGNOSIS — E46 Unspecified protein-calorie malnutrition: Secondary | ICD-10-CM | POA: Diagnosis not present

## 2019-07-13 DIAGNOSIS — Z0000 Encounter for general adult medical examination without abnormal findings: Secondary | ICD-10-CM | POA: Diagnosis not present

## 2019-07-13 DIAGNOSIS — M8580 Other specified disorders of bone density and structure, unspecified site: Secondary | ICD-10-CM | POA: Diagnosis not present

## 2019-07-21 ENCOUNTER — Ambulatory Visit
Admission: RE | Admit: 2019-07-21 | Discharge: 2019-07-21 | Disposition: A | Discharge status: Home / Self Care | Payer: Medicare Other | Source: Ambulatory Visit | Attending: Family Medicine | Admitting: Family Medicine

## 2019-07-21 ENCOUNTER — Other Ambulatory Visit: Payer: Self-pay

## 2019-07-21 DIAGNOSIS — Z1231 Encounter for screening mammogram for malignant neoplasm of breast: Secondary | ICD-10-CM

## 2019-08-15 DIAGNOSIS — H33301 Unspecified retinal break, right eye: Secondary | ICD-10-CM | POA: Diagnosis not present

## 2019-08-15 DIAGNOSIS — H35372 Puckering of macula, left eye: Secondary | ICD-10-CM | POA: Diagnosis not present

## 2019-08-15 DIAGNOSIS — H35033 Hypertensive retinopathy, bilateral: Secondary | ICD-10-CM | POA: Diagnosis not present

## 2019-08-15 DIAGNOSIS — I10 Essential (primary) hypertension: Secondary | ICD-10-CM | POA: Diagnosis not present

## 2019-08-15 DIAGNOSIS — Z9841 Cataract extraction status, right eye: Secondary | ICD-10-CM | POA: Diagnosis not present

## 2019-08-15 DIAGNOSIS — H35373 Puckering of macula, bilateral: Secondary | ICD-10-CM | POA: Diagnosis not present

## 2019-08-15 DIAGNOSIS — Z961 Presence of intraocular lens: Secondary | ICD-10-CM | POA: Diagnosis not present

## 2019-08-15 DIAGNOSIS — Z9842 Cataract extraction status, left eye: Secondary | ICD-10-CM | POA: Diagnosis not present

## 2019-10-15 DIAGNOSIS — Z23 Encounter for immunization: Secondary | ICD-10-CM | POA: Diagnosis not present

## 2020-01-12 ENCOUNTER — Ambulatory Visit: Payer: Medicare Other | Attending: Internal Medicine

## 2020-01-12 DIAGNOSIS — Z23 Encounter for immunization: Secondary | ICD-10-CM | POA: Insufficient documentation

## 2020-01-12 NOTE — Progress Notes (Signed)
   Covid-19 Vaccination Clinic  Name:  Sonya Torres    MRN: [id number] DOB: 1946-11-30  01/12/2020  [name] was observed post Covid-19 immunization for 15 minutes without incidence. She was provided with Vaccine Information Sheet and instruction to access the V-Safe system.   [name] was instructed to call 911 with any severe reactions post vaccine: Marland Kitchen Difficulty breathing  . Swelling of your face and throat  . A fast heartbeat  . A bad rash all over your body  . Dizziness and weakness    Immunizations Administered    Name Date Dose VIS Date Route   Pfizer COVID-19 Vaccine 01/12/2020  6:23 PM 0.3 mL 12/02/2019 Intramuscular   Manufacturer: [place]   Lot: [id number]   [place]: [id number]

## 2020-01-25 DIAGNOSIS — Z0120 Encounter for dental examination and cleaning without abnormal findings: Secondary | ICD-10-CM | POA: Diagnosis not present

## 2020-02-02 ENCOUNTER — Ambulatory Visit: Payer: Medicare Other | Attending: Internal Medicine

## 2020-02-02 DIAGNOSIS — Z23 Encounter for immunization: Secondary | ICD-10-CM | POA: Insufficient documentation

## 2020-02-02 NOTE — Progress Notes (Signed)
   Covid-19 Vaccination Clinic  Name:  Marquiesha Asare    MRN: [id number] DOB: [birth year]  02/02/2020  Ms. Adams was observed post Covid-19 immunization for 15 minutes without incidence. She was provided with Vaccine Information Sheet and instruction to access the V-Safe system.   Ms. Chana was instructed to call 911 with any severe reactions post vaccine: Marland Kitchen Difficulty breathing  . Swelling of your face and throat  . A fast heartbeat  . A bad rash all over your body  . Dizziness and weakness    Immunizations Administered    Name Date Dose VIS Date Route   Pfizer COVID-19 Vaccine 02/02/2020 11:40 AM 0.3 mL 12/02/2019 Intramuscular   Manufacturer: [employer], [employer]   Lot: [id number]   [place]: [id number]

## 2020-04-12 DIAGNOSIS — H524 Presbyopia: Secondary | ICD-10-CM | POA: Diagnosis not present

## 2020-04-12 DIAGNOSIS — Z961 Presence of intraocular lens: Secondary | ICD-10-CM | POA: Diagnosis not present

## 2020-04-12 DIAGNOSIS — H52222 Regular astigmatism, left eye: Secondary | ICD-10-CM | POA: Diagnosis not present

## 2020-04-12 DIAGNOSIS — H5201 Hypermetropia, right eye: Secondary | ICD-10-CM | POA: Diagnosis not present

## 2020-06-20 ENCOUNTER — Other Ambulatory Visit: Payer: Self-pay | Admitting: Obstetrics and Gynecology

## 2020-06-20 DIAGNOSIS — Z1231 Encounter for screening mammogram for malignant neoplasm of breast: Secondary | ICD-10-CM

## 2020-07-12 ENCOUNTER — Other Ambulatory Visit: Payer: Self-pay | Admitting: Family Medicine

## 2020-07-12 DIAGNOSIS — E46 Unspecified protein-calorie malnutrition: Secondary | ICD-10-CM | POA: Diagnosis not present

## 2020-07-12 DIAGNOSIS — M8580 Other specified disorders of bone density and structure, unspecified site: Secondary | ICD-10-CM | POA: Diagnosis not present

## 2020-07-12 DIAGNOSIS — Z0000 Encounter for general adult medical examination without abnormal findings: Secondary | ICD-10-CM | POA: Diagnosis not present

## 2020-07-12 DIAGNOSIS — I10 Essential (primary) hypertension: Secondary | ICD-10-CM | POA: Diagnosis not present

## 2020-07-23 ENCOUNTER — Ambulatory Visit: Payer: Medicare Other

## 2020-07-25 ENCOUNTER — Ambulatory Visit
Admission: RE | Admit: 2020-07-25 | Discharge: 2020-07-25 | Disposition: A | Discharge status: Home / Self Care | Payer: Medicare Other | Source: Ambulatory Visit | Attending: Obstetrics and Gynecology | Admitting: Obstetrics and Gynecology

## 2020-07-25 ENCOUNTER — Other Ambulatory Visit: Payer: Self-pay

## 2020-07-25 DIAGNOSIS — Z1231 Encounter for screening mammogram for malignant neoplasm of breast: Secondary | ICD-10-CM

## 2020-07-26 DIAGNOSIS — Z0120 Encounter for dental examination and cleaning without abnormal findings: Secondary | ICD-10-CM | POA: Diagnosis not present

## 2020-09-15 DIAGNOSIS — Z23 Encounter for immunization: Secondary | ICD-10-CM | POA: Diagnosis not present

## 2020-10-10 ENCOUNTER — Ambulatory Visit
Admission: RE | Admit: 2020-10-10 | Discharge: 2020-10-10 | Disposition: A | Discharge status: Home / Self Care | Payer: Medicare Other | Source: Ambulatory Visit | Attending: Family Medicine | Admitting: Family Medicine

## 2020-10-10 ENCOUNTER — Other Ambulatory Visit: Payer: Self-pay

## 2020-10-10 DIAGNOSIS — Z780 Asymptomatic menopausal state: Secondary | ICD-10-CM | POA: Diagnosis not present

## 2020-10-10 DIAGNOSIS — M8580 Other specified disorders of bone density and structure, unspecified site: Secondary | ICD-10-CM

## 2020-10-10 DIAGNOSIS — M810 Age-related osteoporosis without current pathological fracture: Secondary | ICD-10-CM | POA: Diagnosis not present

## 2020-10-10 DIAGNOSIS — M8588 Other specified disorders of bone density and structure, other site: Secondary | ICD-10-CM | POA: Diagnosis not present

## 2021-02-13 DIAGNOSIS — H903 Sensorineural hearing loss, bilateral: Secondary | ICD-10-CM | POA: Diagnosis not present

## 2021-02-21 DIAGNOSIS — H903 Sensorineural hearing loss, bilateral: Secondary | ICD-10-CM | POA: Diagnosis not present

## 2021-05-02 DIAGNOSIS — H5213 Myopia, bilateral: Secondary | ICD-10-CM | POA: Diagnosis not present

## 2021-05-02 DIAGNOSIS — H5201 Hypermetropia, right eye: Secondary | ICD-10-CM | POA: Diagnosis not present

## 2021-05-02 DIAGNOSIS — Q149 Congenital malformation of posterior segment of eye, unspecified: Secondary | ICD-10-CM | POA: Diagnosis not present

## 2021-05-02 DIAGNOSIS — D3131 Benign neoplasm of right choroid: Secondary | ICD-10-CM | POA: Diagnosis not present

## 2021-05-02 DIAGNOSIS — H52222 Regular astigmatism, left eye: Secondary | ICD-10-CM | POA: Diagnosis not present

## 2021-05-27 DIAGNOSIS — Z01419 Encounter for gynecological examination (general) (routine) without abnormal findings: Secondary | ICD-10-CM | POA: Diagnosis not present

## 2021-05-27 DIAGNOSIS — Z681 Body mass index (BMI) 19 or less, adult: Secondary | ICD-10-CM | POA: Diagnosis not present

## 2021-06-07 DIAGNOSIS — H35372 Puckering of macula, left eye: Secondary | ICD-10-CM | POA: Diagnosis not present

## 2021-06-07 DIAGNOSIS — H33301 Unspecified retinal break, right eye: Secondary | ICD-10-CM | POA: Diagnosis not present

## 2021-06-07 DIAGNOSIS — H35033 Hypertensive retinopathy, bilateral: Secondary | ICD-10-CM | POA: Diagnosis not present

## 2021-06-07 DIAGNOSIS — Z961 Presence of intraocular lens: Secondary | ICD-10-CM | POA: Diagnosis not present

## 2021-06-25 ENCOUNTER — Other Ambulatory Visit: Payer: Self-pay | Admitting: Family Medicine

## 2021-06-25 DIAGNOSIS — Z1231 Encounter for screening mammogram for malignant neoplasm of breast: Secondary | ICD-10-CM

## 2021-08-12 DIAGNOSIS — F411 Generalized anxiety disorder: Secondary | ICD-10-CM | POA: Diagnosis not present

## 2021-08-12 DIAGNOSIS — G4700 Insomnia, unspecified: Secondary | ICD-10-CM | POA: Diagnosis not present

## 2021-08-12 DIAGNOSIS — I10 Essential (primary) hypertension: Secondary | ICD-10-CM | POA: Diagnosis not present

## 2021-08-12 DIAGNOSIS — Z0000 Encounter for general adult medical examination without abnormal findings: Secondary | ICD-10-CM | POA: Diagnosis not present

## 2021-08-16 ENCOUNTER — Ambulatory Visit
Admission: RE | Admit: 2021-08-16 | Discharge: 2021-08-16 | Disposition: A | Discharge status: Home / Self Care | Payer: Medicare Other | Source: Ambulatory Visit

## 2021-08-16 ENCOUNTER — Other Ambulatory Visit: Payer: Self-pay

## 2021-08-16 DIAGNOSIS — Z1231 Encounter for screening mammogram for malignant neoplasm of breast: Secondary | ICD-10-CM

## 2021-08-28 DIAGNOSIS — E876 Hypokalemia: Secondary | ICD-10-CM | POA: Diagnosis not present

## 2021-10-05 DIAGNOSIS — Z23 Encounter for immunization: Secondary | ICD-10-CM | POA: Diagnosis not present

## 2021-10-24 DIAGNOSIS — R200 Anesthesia of skin: Secondary | ICD-10-CM | POA: Diagnosis not present

## 2021-10-25 ENCOUNTER — Other Ambulatory Visit: Payer: Self-pay | Admitting: Sports Medicine

## 2021-10-25 ENCOUNTER — Ambulatory Visit
Admission: RE | Admit: 2021-10-25 | Discharge: 2021-10-25 | Disposition: A | Discharge status: Home / Self Care | Payer: Medicare Other | Source: Ambulatory Visit | Attending: Sports Medicine | Admitting: Sports Medicine

## 2021-10-25 DIAGNOSIS — M25551 Pain in right hip: Secondary | ICD-10-CM

## 2021-10-25 DIAGNOSIS — M545 Low back pain, unspecified: Secondary | ICD-10-CM

## 2021-10-25 DIAGNOSIS — M1611 Unilateral primary osteoarthritis, right hip: Secondary | ICD-10-CM | POA: Diagnosis not present

## 2022-02-12 ENCOUNTER — Other Ambulatory Visit: Payer: Self-pay | Admitting: Nurse Practitioner

## 2022-02-12 DIAGNOSIS — N644 Mastodynia: Secondary | ICD-10-CM | POA: Diagnosis not present

## 2022-02-12 DIAGNOSIS — R208 Other disturbances of skin sensation: Secondary | ICD-10-CM

## 2022-02-27 ENCOUNTER — Ambulatory Visit
Admission: RE | Admit: 2022-02-27 | Discharge: 2022-02-27 | Disposition: A | Discharge status: Home / Self Care | Payer: Medicare Other | Source: Ambulatory Visit | Attending: Nurse Practitioner | Admitting: Nurse Practitioner

## 2022-02-27 DIAGNOSIS — R208 Other disturbances of skin sensation: Secondary | ICD-10-CM

## 2022-02-27 DIAGNOSIS — R922 Inconclusive mammogram: Secondary | ICD-10-CM | POA: Diagnosis not present

## 2022-06-20 DIAGNOSIS — H52222 Regular astigmatism, left eye: Secondary | ICD-10-CM | POA: Diagnosis not present

## 2022-07-11 ENCOUNTER — Other Ambulatory Visit: Payer: Self-pay | Admitting: Family Medicine

## 2022-07-11 ENCOUNTER — Other Ambulatory Visit: Payer: Self-pay | Admitting: Nurse Practitioner

## 2022-07-11 DIAGNOSIS — Z1231 Encounter for screening mammogram for malignant neoplasm of breast: Secondary | ICD-10-CM

## 2022-07-23 DIAGNOSIS — Z681 Body mass index (BMI) 19 or less, adult: Secondary | ICD-10-CM | POA: Diagnosis not present

## 2022-07-23 DIAGNOSIS — Z01419 Encounter for gynecological examination (general) (routine) without abnormal findings: Secondary | ICD-10-CM | POA: Diagnosis not present

## 2022-08-14 ENCOUNTER — Other Ambulatory Visit: Payer: Self-pay | Admitting: Family Medicine

## 2022-08-14 DIAGNOSIS — L989 Disorder of the skin and subcutaneous tissue, unspecified: Secondary | ICD-10-CM | POA: Diagnosis not present

## 2022-08-14 DIAGNOSIS — I10 Essential (primary) hypertension: Secondary | ICD-10-CM | POA: Diagnosis not present

## 2022-08-14 DIAGNOSIS — G4700 Insomnia, unspecified: Secondary | ICD-10-CM | POA: Diagnosis not present

## 2022-08-14 DIAGNOSIS — Z0000 Encounter for general adult medical examination without abnormal findings: Secondary | ICD-10-CM | POA: Diagnosis not present

## 2022-08-14 DIAGNOSIS — C44712 Basal cell carcinoma of skin of right lower limb, including hip: Secondary | ICD-10-CM | POA: Diagnosis not present

## 2022-08-14 DIAGNOSIS — F411 Generalized anxiety disorder: Secondary | ICD-10-CM | POA: Diagnosis not present

## 2022-08-14 DIAGNOSIS — M810 Age-related osteoporosis without current pathological fracture: Secondary | ICD-10-CM

## 2022-08-18 ENCOUNTER — Ambulatory Visit
Admission: RE | Admit: 2022-08-18 | Discharge: 2022-08-18 | Disposition: A | Discharge status: Home / Self Care | Payer: Medicare Other | Source: Ambulatory Visit | Attending: Family Medicine | Admitting: Family Medicine

## 2022-08-18 DIAGNOSIS — Z1231 Encounter for screening mammogram for malignant neoplasm of breast: Secondary | ICD-10-CM

## 2022-09-25 DIAGNOSIS — C44722 Squamous cell carcinoma of skin of right lower limb, including hip: Secondary | ICD-10-CM | POA: Diagnosis not present

## 2022-10-11 DIAGNOSIS — Z23 Encounter for immunization: Secondary | ICD-10-CM | POA: Diagnosis not present

## 2022-10-15 ENCOUNTER — Other Ambulatory Visit: Payer: Self-pay | Admitting: Family Medicine

## 2022-10-15 DIAGNOSIS — R1013 Epigastric pain: Secondary | ICD-10-CM | POA: Diagnosis not present

## 2022-10-15 DIAGNOSIS — F411 Generalized anxiety disorder: Secondary | ICD-10-CM | POA: Diagnosis not present

## 2022-10-20 ENCOUNTER — Other Ambulatory Visit: Payer: Medicare Other

## 2022-10-23 ENCOUNTER — Ambulatory Visit
Admission: RE | Admit: 2022-10-23 | Discharge: 2022-10-23 | Disposition: A | Discharge status: Home / Self Care | Payer: Medicare Other | Source: Ambulatory Visit | Attending: Family Medicine | Admitting: Family Medicine

## 2022-10-23 DIAGNOSIS — R1013 Epigastric pain: Secondary | ICD-10-CM | POA: Diagnosis not present

## 2023-02-06 ENCOUNTER — Ambulatory Visit
Admission: RE | Admit: 2023-02-06 | Discharge: 2023-02-06 | Disposition: A | Discharge status: Home / Self Care | Payer: Medicare Other | Source: Ambulatory Visit | Attending: Family Medicine | Admitting: Family Medicine

## 2023-02-06 DIAGNOSIS — M8589 Other specified disorders of bone density and structure, multiple sites: Secondary | ICD-10-CM | POA: Diagnosis not present

## 2023-02-06 DIAGNOSIS — M810 Age-related osteoporosis without current pathological fracture: Secondary | ICD-10-CM | POA: Diagnosis not present

## 2023-03-31 DIAGNOSIS — K080 Exfoliation of teeth due to systemic causes: Secondary | ICD-10-CM | POA: Diagnosis not present

## 2023-06-16 DIAGNOSIS — H33301 Unspecified retinal break, right eye: Secondary | ICD-10-CM | POA: Diagnosis not present

## 2023-06-16 DIAGNOSIS — Z961 Presence of intraocular lens: Secondary | ICD-10-CM | POA: Diagnosis not present

## 2023-06-16 DIAGNOSIS — H35033 Hypertensive retinopathy, bilateral: Secondary | ICD-10-CM | POA: Diagnosis not present

## 2023-06-16 DIAGNOSIS — H35372 Puckering of macula, left eye: Secondary | ICD-10-CM | POA: Diagnosis not present

## 2023-06-17 DIAGNOSIS — Z1211 Encounter for screening for malignant neoplasm of colon: Secondary | ICD-10-CM | POA: Diagnosis not present

## 2023-06-22 DIAGNOSIS — H5202 Hypermetropia, left eye: Secondary | ICD-10-CM | POA: Diagnosis not present

## 2023-07-07 ENCOUNTER — Other Ambulatory Visit: Payer: Self-pay | Admitting: Family Medicine

## 2023-07-07 DIAGNOSIS — Z1231 Encounter for screening mammogram for malignant neoplasm of breast: Secondary | ICD-10-CM

## 2023-07-22 DIAGNOSIS — D123 Benign neoplasm of transverse colon: Secondary | ICD-10-CM | POA: Diagnosis not present

## 2023-07-22 DIAGNOSIS — Z1211 Encounter for screening for malignant neoplasm of colon: Secondary | ICD-10-CM | POA: Diagnosis not present

## 2023-07-22 DIAGNOSIS — K648 Other hemorrhoids: Secondary | ICD-10-CM | POA: Diagnosis not present

## 2023-07-22 DIAGNOSIS — Z800 Family history of malignant neoplasm of digestive organs: Secondary | ICD-10-CM | POA: Diagnosis not present

## 2023-07-22 DIAGNOSIS — K6389 Other specified diseases of intestine: Secondary | ICD-10-CM | POA: Diagnosis not present

## 2023-07-22 DIAGNOSIS — D122 Benign neoplasm of ascending colon: Secondary | ICD-10-CM | POA: Diagnosis not present

## 2023-07-24 DIAGNOSIS — D122 Benign neoplasm of ascending colon: Secondary | ICD-10-CM | POA: Diagnosis not present

## 2023-07-24 DIAGNOSIS — D123 Benign neoplasm of transverse colon: Secondary | ICD-10-CM | POA: Diagnosis not present

## 2023-07-29 DIAGNOSIS — Z01419 Encounter for gynecological examination (general) (routine) without abnormal findings: Secondary | ICD-10-CM | POA: Diagnosis not present

## 2023-07-29 DIAGNOSIS — Z681 Body mass index (BMI) 19 or less, adult: Secondary | ICD-10-CM | POA: Diagnosis not present

## 2023-08-20 DIAGNOSIS — F411 Generalized anxiety disorder: Secondary | ICD-10-CM | POA: Diagnosis not present

## 2023-08-20 DIAGNOSIS — G4700 Insomnia, unspecified: Secondary | ICD-10-CM | POA: Diagnosis not present

## 2023-08-20 DIAGNOSIS — I10 Essential (primary) hypertension: Secondary | ICD-10-CM | POA: Diagnosis not present

## 2023-08-20 DIAGNOSIS — M810 Age-related osteoporosis without current pathological fracture: Secondary | ICD-10-CM | POA: Diagnosis not present

## 2023-08-20 DIAGNOSIS — Z0000 Encounter for general adult medical examination without abnormal findings: Secondary | ICD-10-CM | POA: Diagnosis not present

## 2023-08-21 ENCOUNTER — Ambulatory Visit
Admission: RE | Admit: 2023-08-21 | Discharge: 2023-08-21 | Disposition: A | Discharge status: Home / Self Care | Payer: Medicare Other | Source: Ambulatory Visit | Attending: Family Medicine | Admitting: Family Medicine

## 2023-08-21 DIAGNOSIS — Z1231 Encounter for screening mammogram for malignant neoplasm of breast: Secondary | ICD-10-CM | POA: Diagnosis not present

## 2023-10-03 DIAGNOSIS — Z23 Encounter for immunization: Secondary | ICD-10-CM | POA: Diagnosis not present

## 2023-10-13 DIAGNOSIS — K080 Exfoliation of teeth due to systemic causes: Secondary | ICD-10-CM | POA: Diagnosis not present

## 2024-02-04 DIAGNOSIS — R110 Nausea: Secondary | ICD-10-CM | POA: Diagnosis not present

## 2024-02-04 DIAGNOSIS — R1013 Epigastric pain: Secondary | ICD-10-CM | POA: Diagnosis not present

## 2024-02-04 DIAGNOSIS — R140 Abdominal distension (gaseous): Secondary | ICD-10-CM | POA: Diagnosis not present

## 2024-02-17 DIAGNOSIS — R110 Nausea: Secondary | ICD-10-CM | POA: Diagnosis not present

## 2024-02-17 DIAGNOSIS — K2970 Gastritis, unspecified, without bleeding: Secondary | ICD-10-CM | POA: Diagnosis not present

## 2024-02-17 DIAGNOSIS — K319 Disease of stomach and duodenum, unspecified: Secondary | ICD-10-CM | POA: Diagnosis not present

## 2024-02-17 DIAGNOSIS — R1013 Epigastric pain: Secondary | ICD-10-CM | POA: Diagnosis not present

## 2024-04-28 DIAGNOSIS — K080 Exfoliation of teeth due to systemic causes: Secondary | ICD-10-CM | POA: Diagnosis not present

## 2024-05-20 DIAGNOSIS — K219 Gastro-esophageal reflux disease without esophagitis: Secondary | ICD-10-CM | POA: Diagnosis not present

## 2024-07-08 ENCOUNTER — Other Ambulatory Visit: Payer: Self-pay | Admitting: Family Medicine

## 2024-07-08 DIAGNOSIS — Z1231 Encounter for screening mammogram for malignant neoplasm of breast: Secondary | ICD-10-CM

## 2024-08-09 DIAGNOSIS — K080 Exfoliation of teeth due to systemic causes: Secondary | ICD-10-CM | POA: Diagnosis not present

## 2024-08-10 DIAGNOSIS — Z01419 Encounter for gynecological examination (general) (routine) without abnormal findings: Secondary | ICD-10-CM | POA: Diagnosis not present

## 2024-08-10 DIAGNOSIS — Z681 Body mass index (BMI) 19 or less, adult: Secondary | ICD-10-CM | POA: Diagnosis not present

## 2024-08-11 DIAGNOSIS — H5202 Hypermetropia, left eye: Secondary | ICD-10-CM | POA: Diagnosis not present

## 2024-08-20 ENCOUNTER — Other Ambulatory Visit: Payer: Self-pay | Admitting: Medical Genetics

## 2024-08-23 ENCOUNTER — Ambulatory Visit
Admission: RE | Admit: 2024-08-23 | Discharge: 2024-08-23 | Disposition: A | Discharge status: Home / Self Care | Source: Ambulatory Visit | Attending: Family Medicine | Admitting: Family Medicine

## 2024-08-23 DIAGNOSIS — Z1231 Encounter for screening mammogram for malignant neoplasm of breast: Secondary | ICD-10-CM | POA: Diagnosis not present

## 2024-08-24 DIAGNOSIS — Z1331 Encounter for screening for depression: Secondary | ICD-10-CM | POA: Diagnosis not present

## 2024-08-24 DIAGNOSIS — F411 Generalized anxiety disorder: Secondary | ICD-10-CM | POA: Diagnosis not present

## 2024-08-24 DIAGNOSIS — I10 Essential (primary) hypertension: Secondary | ICD-10-CM | POA: Diagnosis not present

## 2024-08-24 DIAGNOSIS — G4700 Insomnia, unspecified: Secondary | ICD-10-CM | POA: Diagnosis not present

## 2024-08-24 DIAGNOSIS — M810 Age-related osteoporosis without current pathological fracture: Secondary | ICD-10-CM | POA: Diagnosis not present

## 2024-08-24 DIAGNOSIS — Z131 Encounter for screening for diabetes mellitus: Secondary | ICD-10-CM | POA: Diagnosis not present

## 2024-08-24 DIAGNOSIS — Z0000 Encounter for general adult medical examination without abnormal findings: Secondary | ICD-10-CM | POA: Diagnosis not present

## 2024-08-29 ENCOUNTER — Other Ambulatory Visit (HOSPITAL_BASED_OUTPATIENT_CLINIC_OR_DEPARTMENT_OTHER): Payer: Self-pay | Admitting: Family Medicine

## 2024-08-29 DIAGNOSIS — M810 Age-related osteoporosis without current pathological fracture: Secondary | ICD-10-CM

## 2024-10-13 DIAGNOSIS — L570 Actinic keratosis: Secondary | ICD-10-CM | POA: Diagnosis not present

## 2024-10-13 DIAGNOSIS — D485 Neoplasm of uncertain behavior of skin: Secondary | ICD-10-CM | POA: Diagnosis not present

## 2024-10-24 DIAGNOSIS — K219 Gastro-esophageal reflux disease without esophagitis: Secondary | ICD-10-CM | POA: Diagnosis not present

## 2024-10-26 ENCOUNTER — Other Ambulatory Visit (HOSPITAL_COMMUNITY)
Admission: RE | Admit: 2024-10-26 | Discharge: 2024-10-26 | Disposition: A | Discharge status: Home / Self Care | Payer: Self-pay | Source: Ambulatory Visit | Attending: Medical Genetics | Admitting: Medical Genetics

## 2024-11-08 LAB — GENECONNECT MOLECULAR SCREEN: Genetic Analysis Overall Interpretation: NEGATIVE

## 2024-11-23 DIAGNOSIS — L814 Other melanin hyperpigmentation: Secondary | ICD-10-CM | POA: Diagnosis not present

## 2024-11-23 DIAGNOSIS — D1801 Hemangioma of skin and subcutaneous tissue: Secondary | ICD-10-CM | POA: Diagnosis not present

## 2024-11-23 DIAGNOSIS — L821 Other seborrheic keratosis: Secondary | ICD-10-CM | POA: Diagnosis not present

## 2025-02-15 ENCOUNTER — Other Ambulatory Visit (HOSPITAL_BASED_OUTPATIENT_CLINIC_OR_DEPARTMENT_OTHER)
